# Patient Record
Sex: Male | Born: 1970 | Race: White | Hispanic: No | Marital: Married | Smoking: Never smoker
Health system: Southern US, Community
[De-identification: ages and names within clinical notes are randomized; demographics above are authoritative.]

## PROBLEM LIST (undated history)

## (undated) DIAGNOSIS — K219 Gastro-esophageal reflux disease without esophagitis: Secondary | ICD-10-CM

## (undated) DIAGNOSIS — F419 Anxiety disorder, unspecified: Secondary | ICD-10-CM

---

## 2008-07-25 ENCOUNTER — Emergency Department (HOSPITAL_COMMUNITY): Admission: EM | Admit: 2008-07-25 | Discharge: 2008-07-25 | Payer: Self-pay | Admitting: Emergency Medicine

## 2010-07-25 LAB — DIFFERENTIAL
Basophils Absolute: 0 10*3/uL (ref 0.0–0.1)
Basophils Relative: 0 % (ref 0–1)
Lymphocytes Relative: 22 % (ref 12–46)
Monocytes Absolute: 0.4 10*3/uL (ref 0.1–1.0)
Neutro Abs: 3.3 10*3/uL (ref 1.7–7.7)
Neutrophils Relative %: 70 % (ref 43–77)

## 2010-07-25 LAB — URINALYSIS, ROUTINE W REFLEX MICROSCOPIC
Nitrite: NEGATIVE
Specific Gravity, Urine: <1.005 — ABNORMAL LOW (ref 1.005–1.030)
Urobilinogen, UA: 0.2 mg/dL (ref 0.0–1.0)
pH: 5.5 (ref 5.0–8.0)

## 2010-07-25 LAB — CBC
Hemoglobin: 14.6 g/dL (ref 13.0–17.0)
Platelets: 171 10*3/uL (ref 150–400)
RDW: 12.4 % (ref 11.5–15.5)

## 2010-07-25 LAB — BASIC METABOLIC PANEL
Calcium: 9 mg/dL (ref 8.4–10.5)
Creatinine, Ser: 0.96 mg/dL (ref 0.4–1.5)
GFR calc non Af Amer: >60 mL/min (ref >60)
Glucose, Bld: 92 mg/dL (ref 70–99)
Sodium: 136 mEq/L (ref 135–145)

## 2010-11-19 IMAGING — CT CT HEAD W/O CM
1 series · 16 of 30 positions shown, 20 images · non-contrast
Comparison: None.

CLINICAL DATA: Dizziness.  Syncope 1 week ago.

CT HEAD WITHOUT CONTRAST
TECHNIQUE: Contiguous axial images were obtained from the base of
the skull through the vertex without contrast.

[Series 2: headtrauma 4.8 h37s · axial · 0.47mm/px · z∈[+129,+289]mm · 16 of 36 slices shown, 20 images]
[im 2/36  brain]
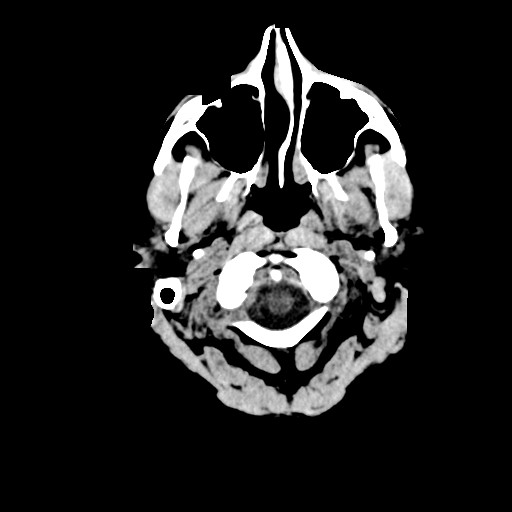
[im 2/36  bone]
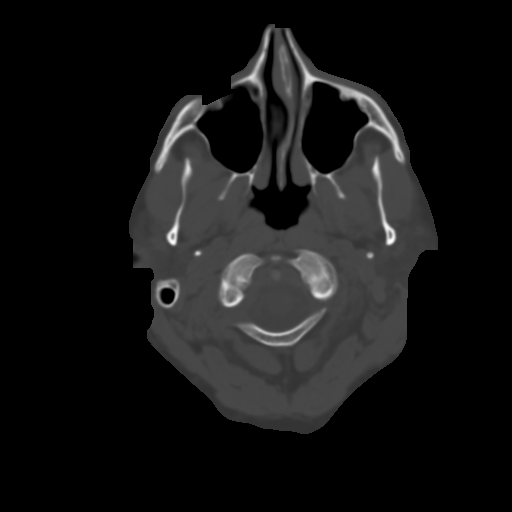
[im 4/36  brain]
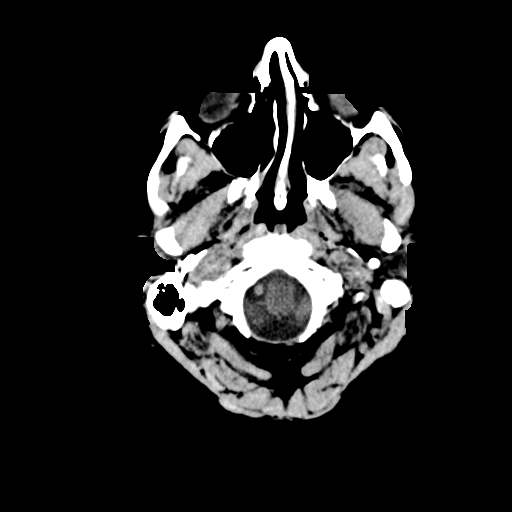
[im 7/36  brain]
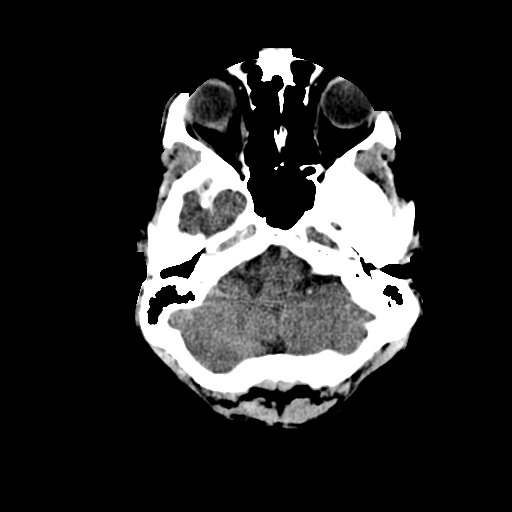
[im 9/36  brain]
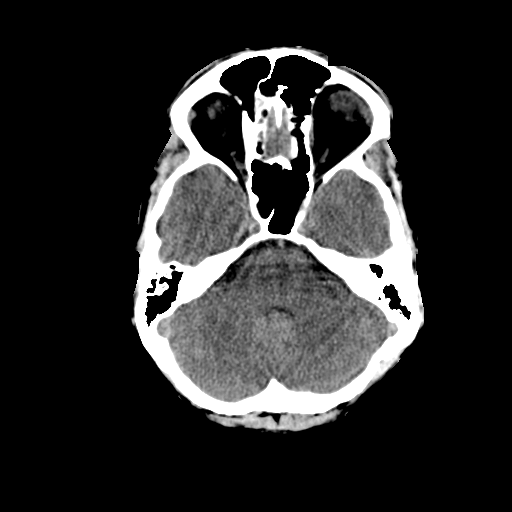
[im 10/36  brain]
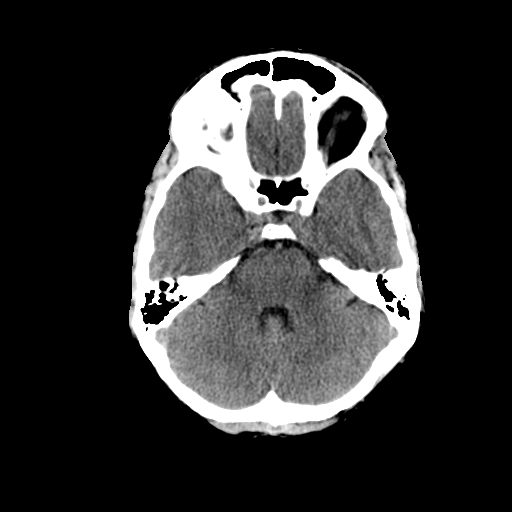
[im 10/36  bone]
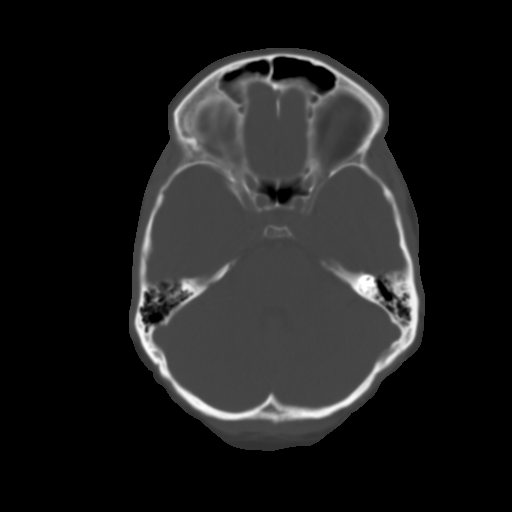
[im 13/36  brain]
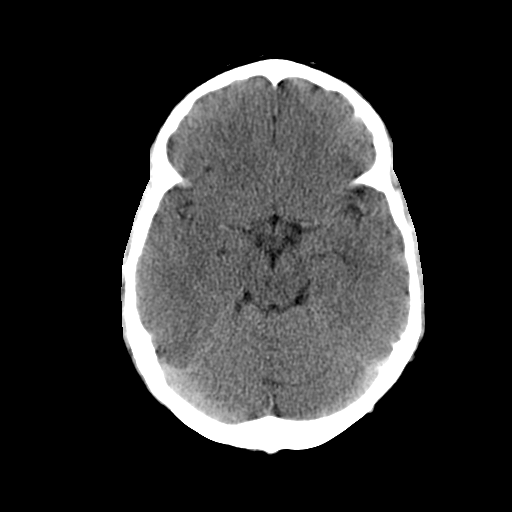
[im 15/36  brain]
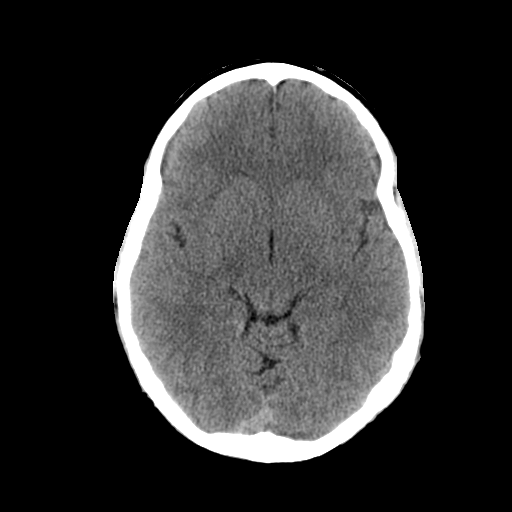
[im 17/36  brain]
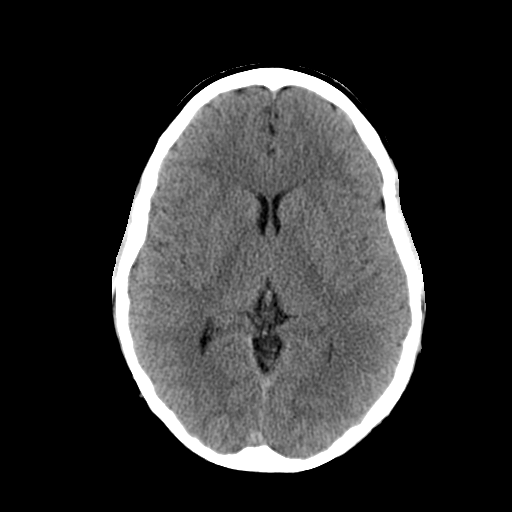
[im 19/36  brain]
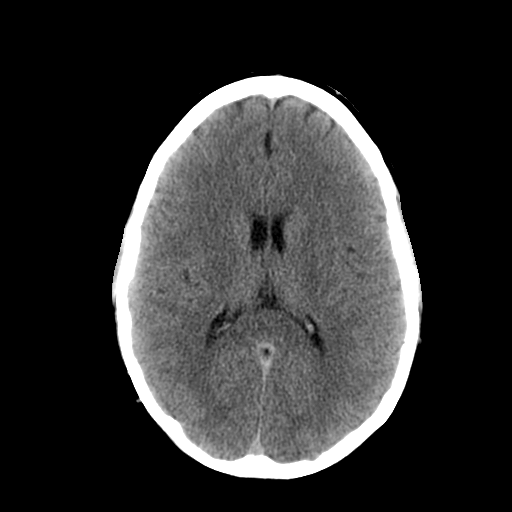
[im 19/36  bone]
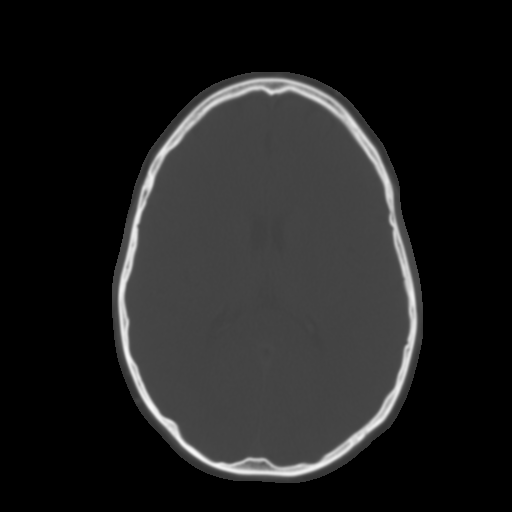
[im 21/36  brain]
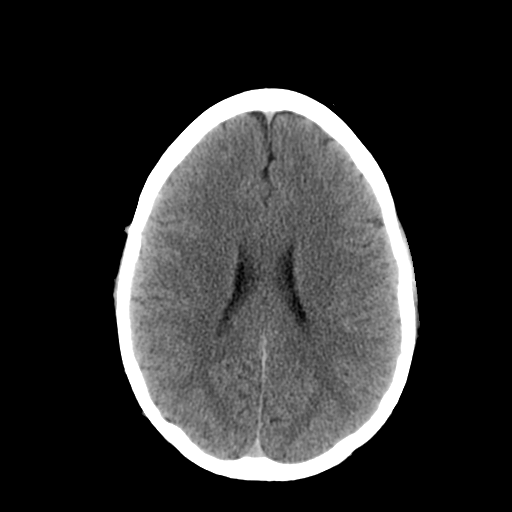
[im 23/36  brain]
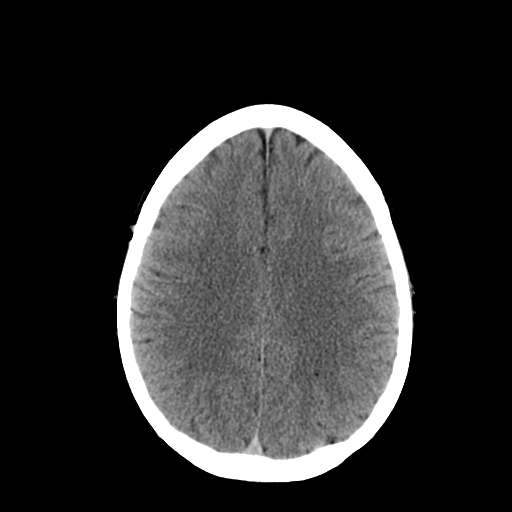
[im 26/36  brain]
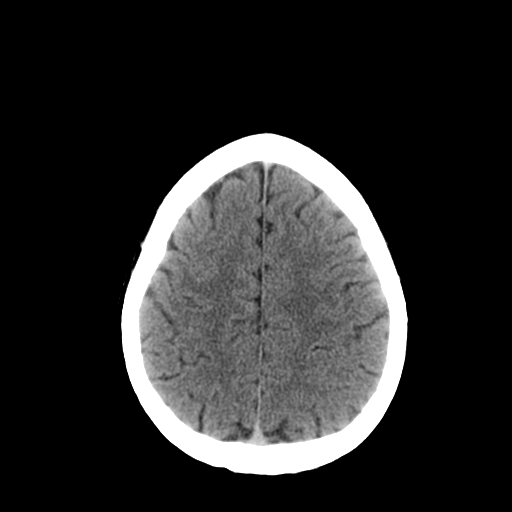
[im 27/36  brain]
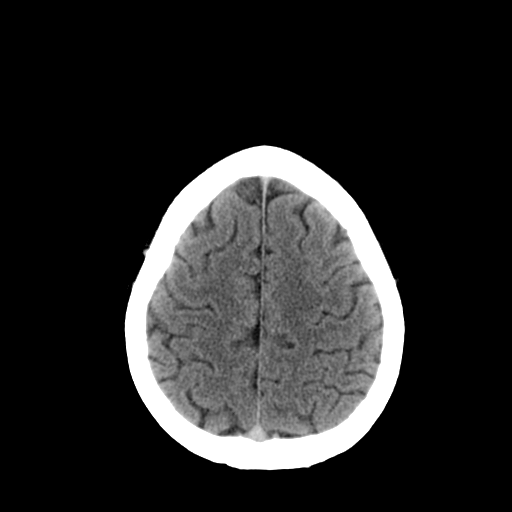
[im 27/36  bone]
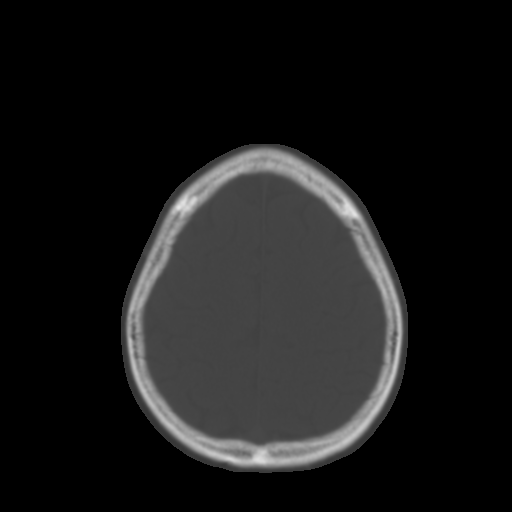
[im 29/36  brain]
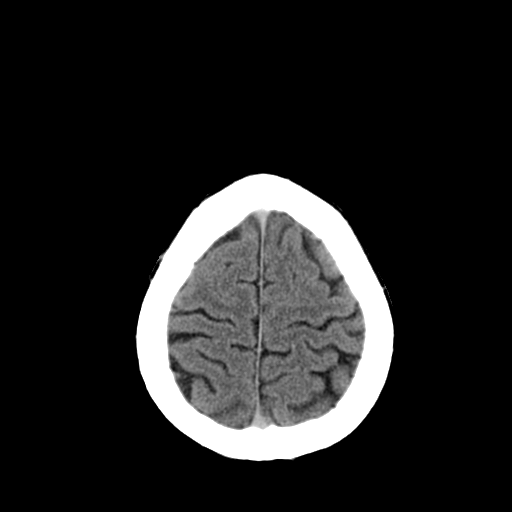
[im 32/36  brain]
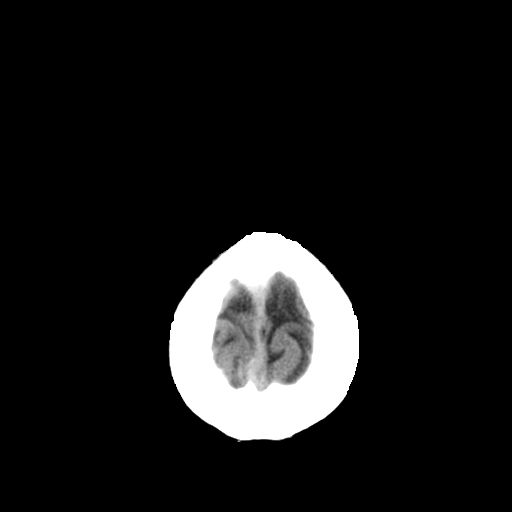
[im 34/36  brain]
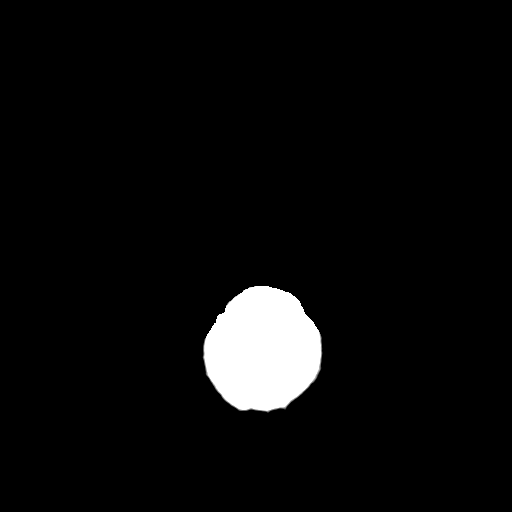

[16 of 30 positions shown; findings below may reference images not displayed]

FINDINGS: Normal appearing cerebral hemispheres and posterior fossa
structures.  Normal size and position of the ventricles.  No
intracranial hemorrhage, mass lesion or evidence of acute
infarction.  Unremarkable bones and included portions of the
paranasal sinuses.
IMPRESSION: Normal examination.

## 2013-11-03 ENCOUNTER — Emergency Department (HOSPITAL_COMMUNITY)
Admission: EM | Admit: 2013-11-03 | Discharge: 2013-11-03 | Disposition: A | Discharge status: Home / Self Care | Payer: Commercial Managed Care - PPO | Attending: Emergency Medicine | Admitting: Emergency Medicine

## 2013-11-03 ENCOUNTER — Encounter (HOSPITAL_COMMUNITY): Payer: Self-pay | Admitting: Emergency Medicine

## 2013-11-03 ENCOUNTER — Emergency Department (HOSPITAL_COMMUNITY): Payer: Commercial Managed Care - PPO

## 2013-11-03 ENCOUNTER — Emergency Department (HOSPITAL_COMMUNITY)
Admission: EM | Admit: 2013-11-03 | Discharge: 2013-11-03 | Disposition: A | Discharge status: Nursing Home (Includes ICF) | Payer: Commercial Managed Care - PPO | Source: Home / Self Care | Attending: Family Medicine | Admitting: Family Medicine

## 2013-11-03 DIAGNOSIS — R079 Chest pain, unspecified: Secondary | ICD-10-CM | POA: Insufficient documentation

## 2013-11-03 DIAGNOSIS — J3489 Other specified disorders of nose and nasal sinuses: Secondary | ICD-10-CM | POA: Insufficient documentation

## 2013-11-03 DIAGNOSIS — H9202 Otalgia, left ear: Secondary | ICD-10-CM

## 2013-11-03 DIAGNOSIS — K219 Gastro-esophageal reflux disease without esophagitis: Secondary | ICD-10-CM | POA: Insufficient documentation

## 2013-11-03 DIAGNOSIS — H9209 Otalgia, unspecified ear: Secondary | ICD-10-CM

## 2013-11-03 DIAGNOSIS — F411 Generalized anxiety disorder: Secondary | ICD-10-CM | POA: Insufficient documentation

## 2013-11-03 DIAGNOSIS — R42 Dizziness and giddiness: Secondary | ICD-10-CM

## 2013-11-03 HISTORY — DX: Anxiety disorder, unspecified: F41.9

## 2013-11-03 HISTORY — DX: Gastro-esophageal reflux disease without esophagitis: K21.9

## 2013-11-03 LAB — I-STAT CHEM 8, ED
BUN: 10 mg/dL (ref 6–23)
CHLORIDE: 104 meq/L (ref 96–112)
CREATININE: 1.1 mg/dL (ref 0.50–1.35)
Calcium, Ion: 1.21 mmol/L (ref 1.12–1.23)
GLUCOSE: 87 mg/dL (ref 70–99)
HEMATOCRIT: 46 % (ref 39.0–52.0)
Hemoglobin: 15.6 g/dL (ref 13.0–17.0)
POTASSIUM: 4.4 meq/L (ref 3.7–5.3)
Sodium: 140 mEq/L (ref 137–147)
TCO2: 25 mmol/L (ref 0–100)

## 2013-11-03 LAB — I-STAT TROPONIN, ED: Troponin i, poc: 0 ng/mL (ref 0.00–0.08)

## 2013-11-03 MED ORDER — ASPIRIN 81 MG PO CHEW
324.0000 mg | CHEWABLE_TABLET | Freq: Once | ORAL | Status: AC
  Administered 2013-11-03: 324 mg via ORAL

## 2013-11-03 MED ORDER — SODIUM CHLORIDE 0.9 % IV SOLN
Freq: Once | INTRAVENOUS | Status: AC
  Administered 2013-11-03: 14:00:00 via INTRAVENOUS

## 2013-11-03 MED ORDER — ASPIRIN 81 MG PO CHEW
CHEWABLE_TABLET | ORAL | Status: AC
  Filled 2013-11-03: qty 4

## 2013-11-03 NOTE — ED Notes (Signed)
Per [facility]. Pt has had GERD x2 weeks. Today he went to [facility] to be checked out. Pt taking zantac at home, not helping. VSS. Pt states hes having problems with his left ear which he thinks is causing him some dizziness. Pt is AAOX4, in NAD skin warm and dry. Hx of anxiety, takes ativan. Given 324 asa by UC.  UC EKG unremarkable.

## 2013-11-03 NOTE — ED Notes (Signed)
Pt returned from xray

## 2013-11-03 NOTE — ED Notes (Signed)
Iv  Ns  20  Angio  r  Hand  1  Att   Site  Patent      tko  Rate  Placed  On  Cardiac  Monitor  Nasal  o2  At  3  l  /  Min

## 2013-11-03 NOTE — ED Provider Notes (Signed)
CSN: [id number]     Arrival date & time 11/03/13  1317 History   First MD Initiated Contact with Patient 11/03/13 1342     Chief Complaint  Patient presents with  . Dizziness   (Consider location/radiation/quality/duration/timing/severity/associated sxs/prior Treatment) HPI Comments: Patient presents with two complaints. First he describes 1 week of left ear discomfort and left frontal sinus pressure without associated fever, changes in hearing, or drainage from ear canal. States he has also had intermittent dizziness.  Next he also mentions one week of waxing/waning chest pain with associated dizziness, anxiety, diaphoresis and sense of having heartburn. Has been taking OTC Zantac with occasional partial relief. Denies fever, cough, hematemesis, dyspnea, palpitations, syncope, melena. Cannot correlate chest discomfort with exertion.  Is a non-smoker. Works in [employer] Family history unremarkable for early cardiovascular disease. States he was noted to have elevated LDL at recent employee wellness screening.   PCP: Dr. Cyndia [place]Badger in [place]   Past Medical History  Diagnosis Date  . GERD (gastroesophageal reflux disease)    History reviewed. No pertinent past surgical history. History reviewed. No pertinent family history. History  Substance Use Topics  . Smoking status: Never Smoker   . Smokeless tobacco: Not on file  . Alcohol Use: Yes    Review of Systems  All other systems reviewed and are negative.   Allergies  Septra  Home Medications   Prior to Admission medications   Medication Sig Start Date End Date Taking? Authorizing Provider  LORazepam (ATIVAN) 0.5 MG tablet Take 0.5 mg by mouth every 8 (eight) hours.   Yes Historical Provider, MD  Ranitidine HCl (ZANTAC PO) Take by mouth.   Yes Historical Provider, MD   BP 153/82  Pulse 84  Temp(Src) 98.1 F (36.7 C) (Oral)  Resp 16  SpO2 100% Physical Exam  Nursing note and vitals reviewed. Constitutional:  He is oriented to person, place, and time. He appears well-developed and well-nourished. No distress.  HENT:  Head: Normocephalic and atraumatic.  Right Ear: Hearing, tympanic membrane, external ear and ear canal normal. No drainage, swelling or tenderness. No foreign bodies. No mastoid tenderness. Tympanic membrane is not injected, not erythematous and not retracted. No middle ear effusion.  Left Ear: Hearing, tympanic membrane, external ear and ear canal normal. No drainage, swelling or tenderness. No foreign bodies. No mastoid tenderness. Tympanic membrane is not injected, not erythematous and not retracted.  No middle ear effusion.  Nose: Nose normal.  Mouth/Throat: Uvula is midline, oropharynx is clear and moist and mucous membranes are normal. No oral lesions. No trismus in the jaw. No uvula swelling.  Eyes: Conjunctivae are normal. No scleral icterus.  Neck: Normal range of motion. Neck supple. No JVD present.  Cardiovascular: Normal rate, regular rhythm and normal heart sounds.   Pulmonary/Chest: Effort normal and breath sounds normal. No respiratory distress. He has no wheezes. He exhibits no tenderness.  Abdominal: Soft. Bowel sounds are normal. He exhibits no distension. There is no tenderness.  Musculoskeletal: Normal range of motion.  Lymphadenopathy:    He has no cervical adenopathy.  Neurological: He is alert and oriented to person, place, and time.  Skin: Skin is warm and dry.  Psychiatric: He has a normal mood and affect. His behavior is normal.    ED Course  Procedures (including critical care time) Labs Review Labs Reviewed - No data to display  Imaging Review No results found.   MDM   1. Dizziness   2. Chest pain, unspecified chest pain  type   3. Ear pain, left    ECG: NSR @ 74 bpm with incomplete RBBB. No old ECGs for comparison. No acute ST/Twave changes.  No obvious clinical explanation for left ear discomfort. No indication of AOM or bacterial sinusitis.  Possible eustachian tube dysfunction. More concerning is patient's intermittent chest discomfort. While patient's TIMI score is 0, I am concerned that he would benefit from at least one set of cardiac markers, +/- CXR, +/- H. Pylori screening, and period of observation. Will transfer to [facility] for cardiac evaluation.     Jess Barters [place], [place] 11/03/13 [phone]

## 2013-11-03 NOTE — Discharge Instructions (Signed)
Chest Pain (Nonspecific) Try not to eat immediately before going to bed. Or eat small frequent meals. Call Dr. Fortunato CurlingBadger's  office tomorrow morning to arrange to be seen this week. You may need to have further testing as an outpatient It is often hard to give a diagnosis for the cause of chest pain. There is always a chance that your pain could be related to something serious, such as a heart attack or a blood clot in the lungs. You need to follow up with your doctor. HOME CARE  If antibiotic medicine was given, take it as directed by your doctor. Finish the medicine even if you start to feel better.  For the next few days, avoid activities that bring on chest pain. Continue physical activities as told by your doctor.  Do not use any tobacco products. This includes cigarettes, chewing tobacco, and e-cigarettes.  Avoid drinking alcohol.  Only take medicine as told by your doctor.  Follow your doctor's suggestions for more testing if your chest pain does not go away.  Keep all doctor visits you made. GET HELP IF:  Your chest pain does not go away, even after treatment.  You have a rash with blisters on your chest.  You have a fever. GET HELP RIGHT AWAY IF:   You have more pain or pain that spreads to your arm, neck, jaw, back, or belly (abdomen).  You have shortness of breath.  You cough more than usual or cough up blood.  You have very bad back or belly pain.  You feel sick to your stomach (nauseous) or throw up (vomit).  You have very bad weakness.  You pass out (faint).  You have chills. This is an emergency. Do not wait to see if the problems will go away. Call your local emergency services (911 in [place] Do not drive yourself to the hospital. MAKE SURE YOU:   Understand these instructions.  Will watch your condition.  Will get help right away if you are not doing well or get worse. Document Released: 09/18/2007 Document Revised: 04/06/2013 Document Reviewed:  09/18/2007 [facility] Patient Information 2015 [employer] [place]. This information is not intended to replace advice given to you by your health care provider. Make sure you discuss any questions you have with your health care provider.

## 2013-11-03 NOTE — ED Notes (Signed)
Pt  Reports  Symptoms  Of   l  Earache      With  dizzyness       As   Well  As  Discomfort    In  Chest          intermittant  Which  He    describes   As  Reflux   -  He  Takes       Zantac  OTC  For the  Reflux

## 2013-11-03 NOTE — ED Provider Notes (Signed)
CSN: [id number]     Arrival date & time 11/03/13  1452 History   First MD Initiated Contact with Patient 11/03/13 1523     Chief Complaint  Patient presents with  . Chest Pain     (Consider location/radiation/quality/duration/timing/severity/associated sxs/prior Treatment) HPI Complains of anterior chest pain for 2 weeks. Patient states "it's reflux" pain occurs after he eats a big meal. Worse at night. Feels acid washing from his stomach up to his esophagus and his throat. Hehasis treated himself with Zantac with partial relief. Pain is nonexertional. No associated shortness of breath nausea or sweating is He also complains of sinus congestion and left ear congestion for one week with feeling of dizziness i.e. feeling of off balance, not lightheadedness. Claritin, and Sudafed with partial relief. No other associated symptoms. Patient [facility] prior to coming here today. Sent here for further evaluation Past Medical History  Diagnosis Date  . GERD (gastroesophageal reflux disease)   . Anxiety    History reviewed. No pertinent past surgical history. No family history on file. History  Substance Use Topics  . Smoking status: Never Smoker   . Smokeless tobacco: Not on file  . Alcohol Use: Yes   ex-smoker quit 20 years ago and smoked for 6 years. Drinks one or 2 beers per night. No illicit drug use  cardiac risk factors hypercholesterolemia, ex-smoker Review of Systems  Constitutional: Negative.   HENT: Positive for congestion, ear pain and sinus pressure.   Respiratory: Negative.   Cardiovascular: Positive for chest pain.  Gastrointestinal: Negative.   Musculoskeletal: Negative.   Skin: Negative.   Neurological: Negative.   Psychiatric/Behavioral: Negative.   All other systems reviewed and are negative.     Allergies  Septra  Home Medications   Prior to Admission medications   Medication Sig Start Date End Date Taking? Authorizing Provider   LORazepam (ATIVAN) 0.5 MG tablet Take 0.5 mg by mouth every 8 (eight) hours.    Historical Provider, MD  Ranitidine HCl (ZANTAC PO) Take by mouth.    Historical Provider, MD   BP 114/82  Pulse 59  Temp(Src) 97.8 F (36.6 C) (Oral)  Resp 17  Ht 6\' 1"  (1.854 m)  Wt 215 lb (97.523 kg)  BMI 28.37 kg/m2  SpO2 97% Physical Exam  Nursing note and vitals reviewed. Constitutional: He appears well-developed and well-nourished.  HENT:  Head: Normocephalic and atraumatic.  Eyes: Conjunctivae are normal. Pupils are equal, round, and reactive to light.  Neck: Neck supple. No tracheal deviation present. No thyromegaly present.  Cardiovascular: Normal rate and regular rhythm.   No murmur heard. Pulmonary/Chest: Effort normal and breath sounds normal.  Abdominal: Soft. Bowel sounds are normal. He exhibits no distension. There is no tenderness.  Musculoskeletal: Normal range of motion. He exhibits no edema and no tenderness.  Neurological: He is alert. Coordination normal.  Skin: Skin is warm and dry. No rash noted.  Psychiatric: He has a normal mood and affect.   Chest x-ray viewed by me. 30 5 PM patient resting comfortablly. Results for orders placed during the hospital encounter of 11/03/13  I-STAT TROPOININ, ED      Result Value Ref Range   Troponin i, poc 0.00  0.00 - 0.08 ng/mL   Comment 3           I-STAT CHEM 8, ED      Result Value Ref Range   Sodium 140  137 - 147 mEq/L   Potassium 4.4  3.7 - 5.3 mEq/L   Chloride 104  96 - 112 mEq/L   BUN 10  6 - 23 mg/dL   Creatinine, Ser 1.611.10  0.50 - 1.35 mg/dL   Glucose, Bld 87  70 - 99 mg/dL   Calcium, Ion 0.961.21  0.451.12 - 1.23 mmol/L   TCO2 25  0 - 100 mmol/L   Hemoglobin 15.6  13.0 - 17.0 g/dL   HCT 40.946.0  81.139.0 - 91.452.0 %   Dg Chest 2 View  11/03/2013   CLINICAL DATA:  Chest discomfort.  EXAM: CHEST  2 VIEW  COMPARISON:  None.  FINDINGS: The heart size and mediastinal contours are within normal limits. Both lungs are clear. The visualized  skeletal structures are unremarkable.  IMPRESSION: No active cardiopulmonary disease.   Electronically Signed   By: Charlett NoseKevin  Dover M.D.   On: 11/03/2013 16:13    ED Course  Procedures (including critical care time) Labs Review Labs Reviewed  Rosezena SensorI-STAT TROPOININ, ED  I-STAT CHEM 8, ED    medical decision making:heart score equals  see above for medical decision making1. I strongly doubt acute coronary syndrome. Symptoms are highly consistent with reflux. Spoke with Elizabeth Palaueresa Anderson, nurse practitioner for Dr. Cyndia Bentbadger . Plan patient to call office for followup this week and possible outpatient cardiac workup. I discussed with patient further treatment for suspected sinusitis. He is early in the course of the illness. Will not do with antibiotics presently. He is in agreement Diagnosis #1 nonspecific chest pain #2 sinusitis  Imaging Review No results found.   EKG Interpretation   Date/Time:  Wednesday November 03 2013 14:59:27 EDT Ventricular Rate:  62 PR Interval:  168 QRS Duration: 112 QT Interval:  420 QTC Calculation: 426 R Axis:   67 Text Interpretation:  Normal sinus rhythm Incomplete right bundle branch  block Borderline ECG No significant change since last tracing Confirmed by  Ethelda ChickJACUBOWITZ  MD, Moksha Dorgan [phone]([id number]) on 11/03/2013 3:57:29 PM      MDM  See Above for medical decision making Final diagnoses:  None    Dx#1 non specific chest pain #2sinusitis    Doug SouSam Kamylah Manzo, MD 11/03/13 1640

## 2013-11-05 NOTE — ED Provider Notes (Signed)
Medical screening examination/treatment/procedure(s) were performed by a resident physician or non-physician practitioner and as the supervising physician I was immediately available for consultation/collaboration.  Jartavious Mckimmy, MD    Karl Erway S Trissa Molina, MD 11/05/13 0728 

## 2015-01-25 ENCOUNTER — Encounter (HOSPITAL_COMMUNITY): Payer: Self-pay | Admitting: *Deleted

## 2015-01-25 ENCOUNTER — Emergency Department (INDEPENDENT_AMBULATORY_CARE_PROVIDER_SITE_OTHER)
Admission: EM | Admit: 2015-01-25 | Discharge: 2015-01-25 | Disposition: A | Discharge status: Home / Self Care | Payer: Commercial Managed Care - PPO | Source: Home / Self Care

## 2015-01-25 DIAGNOSIS — R42 Dizziness and giddiness: Secondary | ICD-10-CM | POA: Diagnosis not present

## 2015-01-25 DIAGNOSIS — J0100 Acute maxillary sinusitis, unspecified: Secondary | ICD-10-CM | POA: Diagnosis not present

## 2015-01-25 MED ORDER — METHYLPREDNISOLONE 4 MG PO TBPK: ORAL_TABLET | ORAL | Status: AC

## 2015-01-25 MED ORDER — AMOXICILLIN-POT CLAVULANATE 875-125 MG PO TABS: 1.0000 | ORAL_TABLET | Freq: Two times a day (BID) | ORAL | Status: AC

## 2015-01-25 MED ORDER — MECLIZINE HCL 25 MG PO TABS: 25.0000 mg | ORAL_TABLET | Freq: Three times a day (TID) | ORAL | Status: AC | PRN

## 2015-01-25 NOTE — ED Notes (Signed)
Pt  Reports   Pain r side  Face       With   dizzyness      And    Lightheaded        With  Onset  Of  Symptoms     For   Several  Days       pt  Reports  Pain  Actually  Involves  His  Teeth  As  Well     He  Is  Sitting upright on the  Exam table      Alert and  Oriented

## 2015-01-25 NOTE — ED Provider Notes (Signed)
CSN: [id number]     Arrival date & time 01/25/15  1511 History   None    Chief Complaint  Patient presents with  . Facial Pain   (Consider location/radiation/quality/duration/timing/severity/associated sxs/prior Treatment) The history is provided by the patient.    Past Medical History  Diagnosis Date  . GERD (gastroesophageal reflux disease)   . Anxiety    History reviewed. No pertinent past surgical history. History reviewed. No pertinent family history. Social History  Substance Use Topics  . Smoking status: Never Smoker   . Smokeless tobacco: None  . Alcohol Use: Yes    Review of Systems  Constitutional: Positive for fever.  HENT: Positive for facial swelling.   Eyes: Negative.   Respiratory: Negative.   Cardiovascular: Negative.   Gastrointestinal: Negative.   Endocrine: Negative.   Genitourinary: Negative.   Musculoskeletal: Negative.   Skin: Negative.   Allergic/Immunologic: Negative.   Neurological: Negative.   Hematological: Negative.   Psychiatric/Behavioral: Negative.     Allergies  Septra  Home Medications   Prior to Admission medications   Medication Sig Start Date End Date Taking? Authorizing Provider  amoxicillin-clavulanate (AUGMENTIN) 875-125 MG tablet Take 1 tablet by mouth 2 (two) times daily. 01/25/15   Deatra CanterWilliam J Oxford, FNP  desloratadine (CLARINEX) 5 MG tablet Take 5 mg by mouth daily.     Historical Provider, MD  meclizine (ANTIVERT) 25 MG tablet Take 1 tablet (25 mg total) by mouth 3 (three) times daily as needed for dizziness. 01/25/15   Deatra CanterWilliam J Oxford, FNP  methylPREDNISolone (MEDROL DOSEPAK) 4 MG TBPK tablet Take as directed 01/25/15   Deatra CanterWilliam J Oxford, FNP  mometasone (NASONEX) 50 MCG/ACT nasal spray Place 2 sprays into the nose daily.    Historical Provider, MD   Meds Ordered and Administered this Visit  Medications - No data to display  BP 130/92 mmHg  Pulse 74  Temp(Src) 97.9 F (36.6 C) (Oral)  Resp 16  SpO2 98% No  data found.   Physical Exam  Constitutional: He is oriented to person, place, and time. He appears well-developed and well-nourished.  HENT:  Head: Normocephalic.  Right Ear: External ear normal.  Left Ear: External ear normal.  Mouth/Throat: Oropharynx is clear and moist.  Right maxillary sinus tenderness and right upper jaw tenderness with palpation  Neck: Normal range of motion. Neck supple.  Cardiovascular: Normal rate and regular rhythm.   Pulmonary/Chest: Effort normal and breath sounds normal.  Abdominal: Soft. Bowel sounds are normal.  Neurological: He is alert and oriented to person, place, and time.    ED Course  Procedures (including critical care time)  Labs Review Labs Reviewed - No data to display  Imaging Review No results found.   Visual Acuity Review  Right Eye Distance:   Left Eye Distance:   Bilateral Distance:    Right Eye Near:   Left Eye Near:    Bilateral Near:         MDM   1. Subacute maxillary sinusitis   2. Vertigo    Augmentin 875mg  one po bid x 2 weeks, medrol dose pack as directed, antivert 25mg  one po tid prn  Push po fluids, follow up with PCP, explained may need ENT referral.  Deatra CanterWilliam J Oxford FNP    Deatra CanterWilliam J Oxford, FNP 01/25/15 1558

## 2015-01-25 NOTE — Discharge Instructions (Signed)

## 2016-02-28 IMAGING — CR DG CHEST 2V
2 series · 2 of 2 positions shown · non-contrast
Comparison: None.

CLINICAL DATA: Chest discomfort.

EXAM:
CHEST  2 VIEW

[w chest pa]
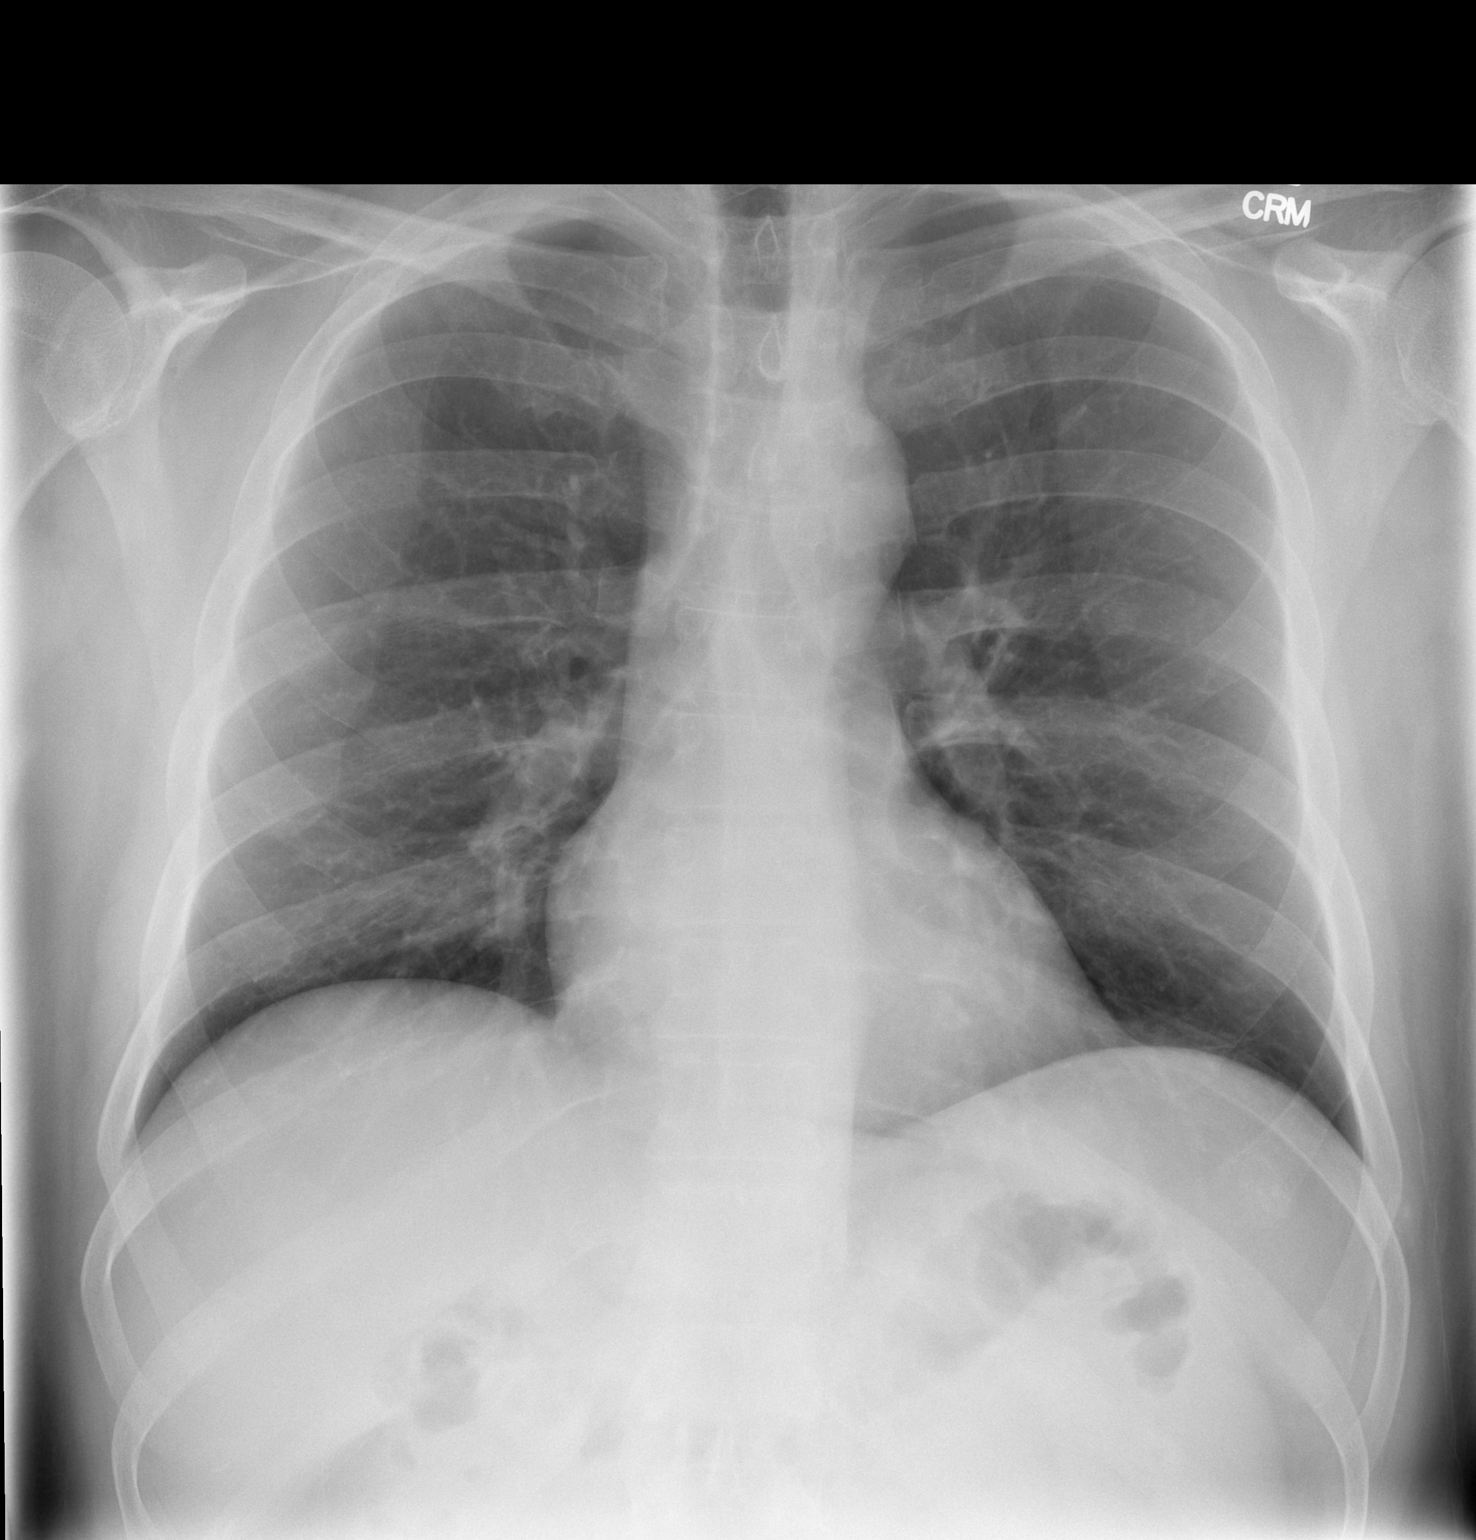

[w chest lat]
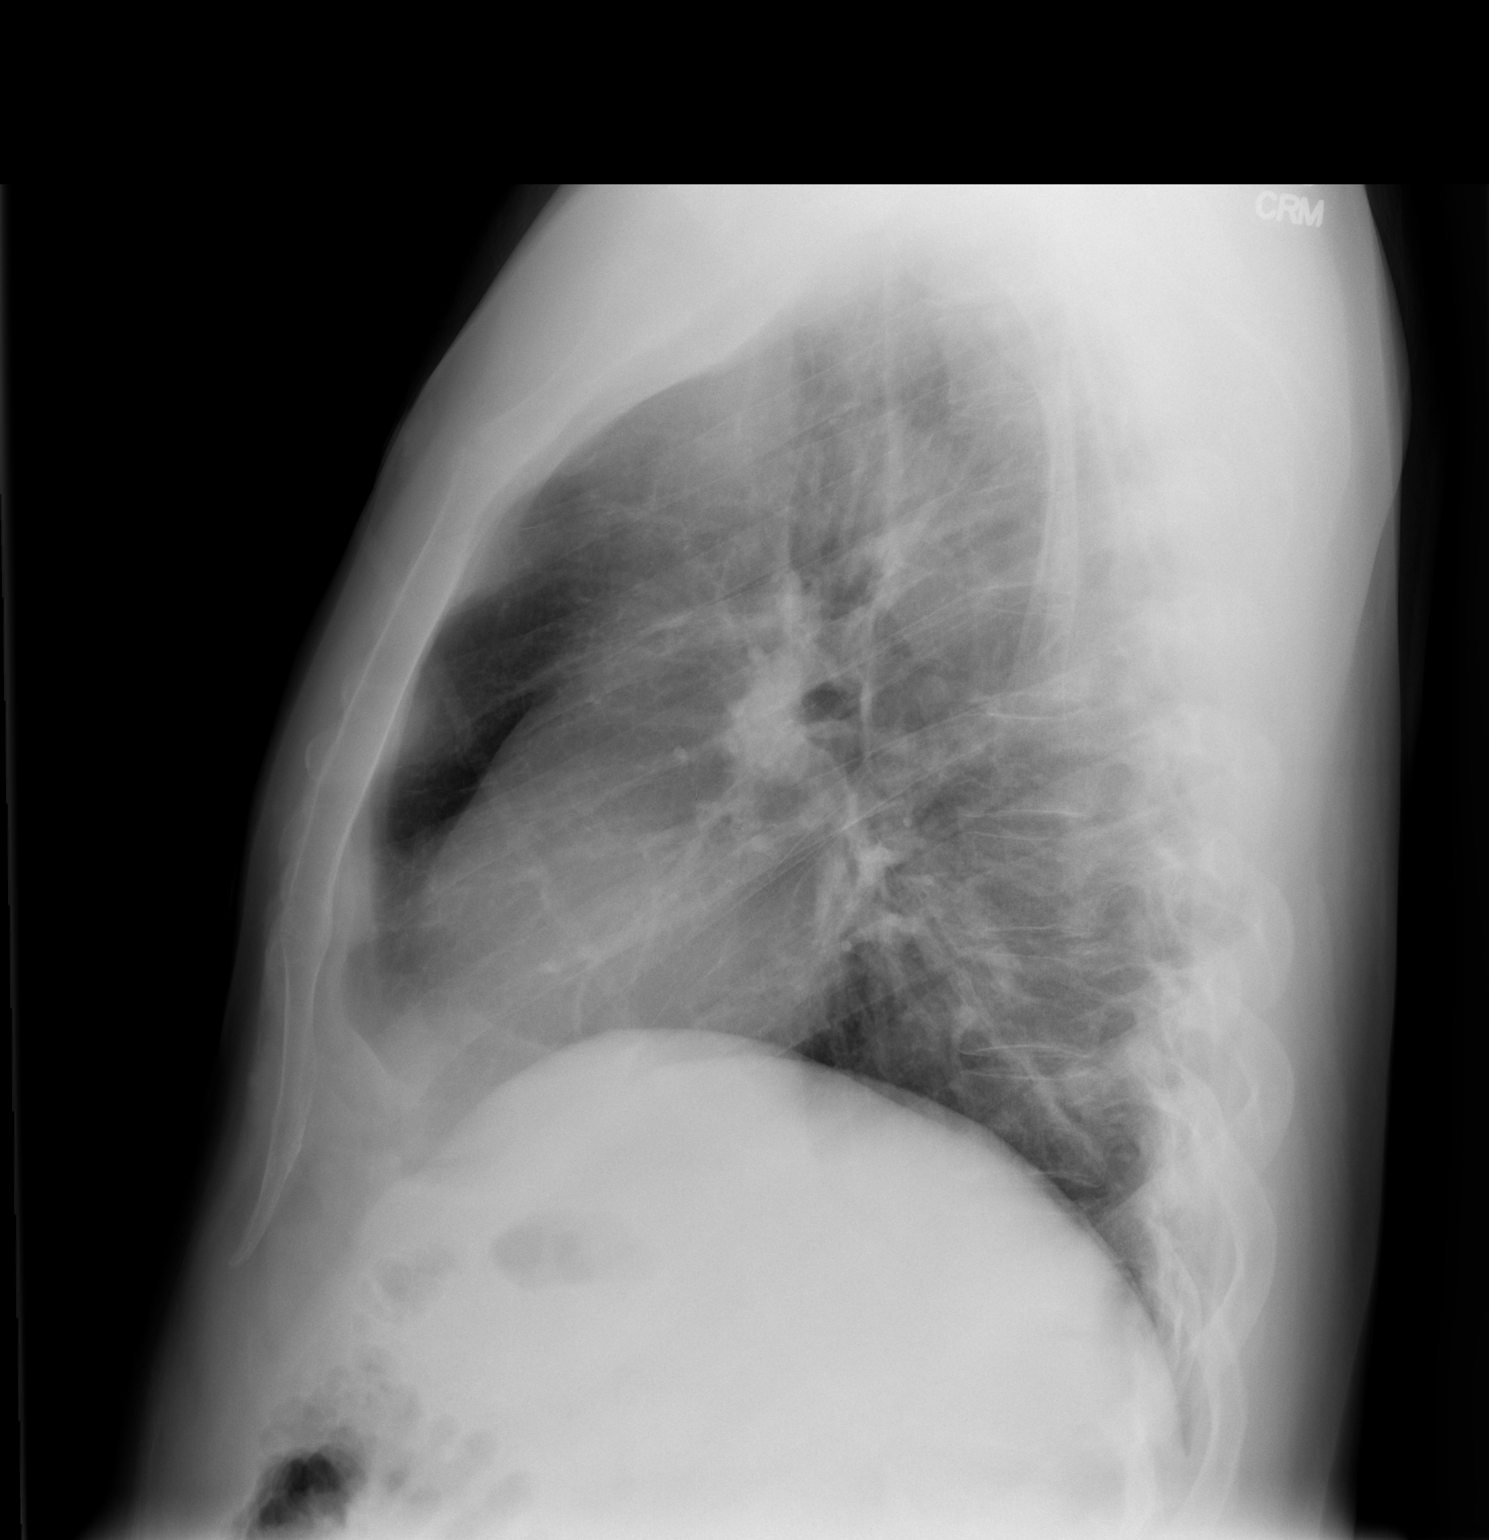

[2 of 2 positions shown; findings below may reference images not displayed]

FINDINGS: The heart size and mediastinal contours are within normal limits.
Both lungs are clear. The visualized skeletal structures are
unremarkable.
IMPRESSION: No active cardiopulmonary disease.

## 2020-09-18 ENCOUNTER — Other Ambulatory Visit: Payer: Self-pay | Admitting: Otolaryngology

## 2020-09-18 DIAGNOSIS — H7102 Cholesteatoma of attic, left ear: Secondary | ICD-10-CM

## 2020-10-28 ENCOUNTER — Other Ambulatory Visit: Payer: Self-pay

## 2020-10-28 ENCOUNTER — Ambulatory Visit
Admission: RE | Admit: 2020-10-28 | Discharge: 2020-10-28 | Disposition: A | Discharge status: Home / Self Care | Payer: Managed Care, Other (non HMO) | Source: Ambulatory Visit | Attending: Otolaryngology | Admitting: Otolaryngology

## 2020-10-28 DIAGNOSIS — H7102 Cholesteatoma of attic, left ear: Secondary | ICD-10-CM
